# Patient Record
Sex: Female | Born: 1965 | Hispanic: No | Smoking: Never smoker
Health system: Southern US, Community
[De-identification: ages and names within clinical notes are randomized; demographics above are authoritative.]

## PROBLEM LIST (undated history)

## (undated) DIAGNOSIS — M5030 Other cervical disc degeneration, unspecified cervical region: Secondary | ICD-10-CM

## (undated) DIAGNOSIS — G5600 Carpal tunnel syndrome, unspecified upper limb: Secondary | ICD-10-CM

## (undated) DIAGNOSIS — F419 Anxiety disorder, unspecified: Secondary | ICD-10-CM

## (undated) DIAGNOSIS — T7840XA Allergy, unspecified, initial encounter: Secondary | ICD-10-CM

## (undated) DIAGNOSIS — M65332 Trigger finger, left middle finger: Secondary | ICD-10-CM

## (undated) DIAGNOSIS — I10 Essential (primary) hypertension: Secondary | ICD-10-CM

## (undated) DIAGNOSIS — G4733 Obstructive sleep apnea (adult) (pediatric): Secondary | ICD-10-CM

## (undated) HISTORY — DX: Allergy, unspecified, initial encounter: T78.40XA

## (undated) HISTORY — DX: Other cervical disc degeneration, unspecified cervical region: M50.30

## (undated) HISTORY — PX: OTHER SURGICAL HISTORY: SHX169

## (undated) HISTORY — PX: CERVICAL FUSION: SHX112

## (undated) HISTORY — DX: Obstructive sleep apnea (adult) (pediatric): G47.33

## (undated) HISTORY — PX: CRYOTHERAPY: SHX1416

## (undated) HISTORY — DX: Essential (primary) hypertension: I10

## (undated) HISTORY — DX: Anxiety disorder, unspecified: F41.9

## (undated) HISTORY — DX: Carpal tunnel syndrome, unspecified upper limb: G56.00

## (undated) HISTORY — DX: Trigger finger, left middle finger: M65.332

---

## 1998-06-16 ENCOUNTER — Other Ambulatory Visit: Admission: RE | Admit: 1998-06-16 | Discharge: 1998-06-16 | Payer: Self-pay | Admitting: Obstetrics and Gynecology

## 1999-06-22 ENCOUNTER — Other Ambulatory Visit: Admission: RE | Admit: 1999-06-22 | Discharge: 1999-06-22 | Payer: Self-pay | Admitting: *Deleted

## 2000-08-07 ENCOUNTER — Other Ambulatory Visit: Admission: RE | Admit: 2000-08-07 | Discharge: 2000-08-07 | Payer: Self-pay | Admitting: *Deleted

## 2001-08-13 ENCOUNTER — Other Ambulatory Visit: Admission: RE | Admit: 2001-08-13 | Discharge: 2001-08-13 | Payer: Self-pay | Admitting: Obstetrics and Gynecology

## 2002-09-01 ENCOUNTER — Other Ambulatory Visit: Admission: RE | Admit: 2002-09-01 | Discharge: 2002-09-01 | Payer: Self-pay | Admitting: Obstetrics and Gynecology

## 2012-01-07 ENCOUNTER — Ambulatory Visit (INDEPENDENT_AMBULATORY_CARE_PROVIDER_SITE_OTHER): Payer: BC Managed Care – PPO | Admitting: Radiology

## 2012-01-07 DIAGNOSIS — Z23 Encounter for immunization: Secondary | ICD-10-CM

## 2013-04-15 ENCOUNTER — Ambulatory Visit (HOSPITAL_BASED_OUTPATIENT_CLINIC_OR_DEPARTMENT_OTHER): Payer: Self-pay

## 2016-06-11 DIAGNOSIS — Z1231 Encounter for screening mammogram for malignant neoplasm of breast: Secondary | ICD-10-CM | POA: Diagnosis not present

## 2016-06-11 DIAGNOSIS — Z803 Family history of malignant neoplasm of breast: Secondary | ICD-10-CM | POA: Diagnosis not present

## 2016-07-29 DIAGNOSIS — Z01419 Encounter for gynecological examination (general) (routine) without abnormal findings: Secondary | ICD-10-CM | POA: Diagnosis not present

## 2016-11-05 DIAGNOSIS — J324 Chronic pansinusitis: Secondary | ICD-10-CM | POA: Diagnosis not present

## 2016-12-27 DIAGNOSIS — Z23 Encounter for immunization: Secondary | ICD-10-CM | POA: Diagnosis not present

## 2017-05-05 DIAGNOSIS — J309 Allergic rhinitis, unspecified: Secondary | ICD-10-CM | POA: Diagnosis not present

## 2017-05-05 DIAGNOSIS — H9202 Otalgia, left ear: Secondary | ICD-10-CM | POA: Diagnosis not present

## 2017-05-21 DIAGNOSIS — R05 Cough: Secondary | ICD-10-CM | POA: Diagnosis not present

## 2017-05-21 DIAGNOSIS — H938X3 Other specified disorders of ear, bilateral: Secondary | ICD-10-CM | POA: Diagnosis not present

## 2017-06-18 DIAGNOSIS — Z1231 Encounter for screening mammogram for malignant neoplasm of breast: Secondary | ICD-10-CM | POA: Diagnosis not present

## 2017-06-24 DIAGNOSIS — R05 Cough: Secondary | ICD-10-CM | POA: Diagnosis not present

## 2017-08-08 DIAGNOSIS — Z01419 Encounter for gynecological examination (general) (routine) without abnormal findings: Secondary | ICD-10-CM | POA: Diagnosis not present

## 2017-08-08 DIAGNOSIS — Z6824 Body mass index (BMI) 24.0-24.9, adult: Secondary | ICD-10-CM | POA: Diagnosis not present

## 2017-09-17 DIAGNOSIS — L719 Rosacea, unspecified: Secondary | ICD-10-CM | POA: Diagnosis not present

## 2017-12-26 DIAGNOSIS — Z23 Encounter for immunization: Secondary | ICD-10-CM | POA: Diagnosis not present

## 2017-12-26 DIAGNOSIS — Z1159 Encounter for screening for other viral diseases: Secondary | ICD-10-CM | POA: Diagnosis not present

## 2018-02-16 DIAGNOSIS — M67432 Ganglion, left wrist: Secondary | ICD-10-CM | POA: Diagnosis not present

## 2018-02-16 DIAGNOSIS — M79642 Pain in left hand: Secondary | ICD-10-CM | POA: Diagnosis not present

## 2018-03-19 ENCOUNTER — Other Ambulatory Visit: Payer: Self-pay | Admitting: Family Medicine

## 2018-03-19 ENCOUNTER — Ambulatory Visit
Admission: RE | Admit: 2018-03-19 | Discharge: 2018-03-19 | Disposition: A | Discharge status: Home / Self Care | Payer: 59 | Source: Ambulatory Visit | Attending: Family Medicine | Admitting: Family Medicine

## 2018-03-19 DIAGNOSIS — R059 Cough, unspecified: Secondary | ICD-10-CM

## 2018-03-19 DIAGNOSIS — R05 Cough: Secondary | ICD-10-CM

## 2018-03-19 DIAGNOSIS — R079 Chest pain, unspecified: Secondary | ICD-10-CM

## 2019-03-16 ENCOUNTER — Other Ambulatory Visit: Payer: Self-pay | Admitting: Family Medicine

## 2019-03-16 DIAGNOSIS — M542 Cervicalgia: Secondary | ICD-10-CM

## 2019-03-16 DIAGNOSIS — M5412 Radiculopathy, cervical region: Secondary | ICD-10-CM

## 2019-08-07 ENCOUNTER — Other Ambulatory Visit: Payer: 59

## 2019-08-11 ENCOUNTER — Other Ambulatory Visit: Payer: Self-pay

## 2019-08-11 ENCOUNTER — Ambulatory Visit
Admission: RE | Admit: 2019-08-11 | Discharge: 2019-08-11 | Disposition: A | Discharge status: Home / Self Care | Payer: BC Managed Care – PPO | Source: Ambulatory Visit | Attending: Family Medicine | Admitting: Family Medicine

## 2019-08-11 DIAGNOSIS — M5412 Radiculopathy, cervical region: Secondary | ICD-10-CM

## 2019-08-11 DIAGNOSIS — M542 Cervicalgia: Secondary | ICD-10-CM

## 2020-01-27 ENCOUNTER — Encounter (HOSPITAL_COMMUNITY): Payer: Self-pay

## 2020-01-27 ENCOUNTER — Other Ambulatory Visit: Payer: Self-pay

## 2020-01-27 ENCOUNTER — Ambulatory Visit (HOSPITAL_COMMUNITY)
Admission: EM | Admit: 2020-01-27 | Discharge: 2020-01-27 | Disposition: A | Discharge status: Home / Self Care | Payer: BC Managed Care – PPO | Attending: Family Medicine | Admitting: Family Medicine

## 2020-01-27 DIAGNOSIS — T50905A Adverse effect of unspecified drugs, medicaments and biological substances, initial encounter: Secondary | ICD-10-CM

## 2020-01-27 NOTE — ED Triage Notes (Signed)
Pt states she had sudden onset dizziness and sob and anxious. Pt has hx of high blood pressure. Pt is having difficulty ambulating due to shaking and dizziness and numbness in the extremities. Pt is aox4 .

## 2020-01-27 NOTE — Discharge Instructions (Signed)
I believe this is a reaction to the CBD oil Recommend not taking this anymore.  Drink plenty of water.  Take your ativan when you get home.  Go to the ER for worsening symptoms.

## 2020-01-28 NOTE — ED Provider Notes (Signed)
[facility]    CSN: [id number] Arrival date & time: 01/27/20  1353      History   Chief Complaint Chief Complaint  Patient presents with   Dizziness    since 1300    HPI Margaret Patterson is a 54 y.o. female.   Patient is a 54 year old female presents today with dizziness, shortness of breath, anxiousness, dry mouth and numbness and tingling in extremities.  Symptoms have been constant for the past couple hours.  She took a dose of CBD oil this morning prior to going to work.  She has taken this in the past but this was a new product that she had purchased.  Symptoms started a few hours after taking the medication.  Denies any specific chest pain, fevers, chills, cough.  No other new medications.  History of hypertension and takes blood pressure medication to include losartan and Norvasc.  History of anxiety     History reviewed. No pertinent past medical history.  There are no problems to display for this patient.   History reviewed. No pertinent surgical history.  OB History   No obstetric history on file.      Home Medications    Prior to Admission medications   Not on File    Family History History reviewed. No pertinent family history.  Social History Social History   Tobacco Use   Smoking status: Never Smoker   Smokeless tobacco: Never Used  Building services engineer Use: Never used  Substance Use Topics   Alcohol use: Not Currently   Drug use: Never     Allergies   Benazepril and Pseudoephedrine   Review of Systems Review of Systems   Physical Exam Triage Vital Signs ED Triage Vitals  Enc Vitals Group     BP 01/27/20 1400 (!) 151/91     Pulse Rate 01/27/20 1400 (!) 127     Resp 01/27/20 1400 (!) 23     Temp 01/27/20 1400 98 F (36.7 C)     Temp Source 01/27/20 1400 Oral     SpO2 01/27/20 1400 100 %     Weight --      Height --      Head Circumference --      Peak Flow --      Pain Score 01/27/20 1401 0     Pain Loc  --      Pain Edu? --      Excl. in GC? --    No data found.  Updated Vital Signs BP (!) 151/91 (BP Location: Right Arm)    Pulse (!) 127    Temp 98 F (36.7 C) (Oral)    Resp (!) 23    SpO2 100%   Visual Acuity Right Eye Distance:   Left Eye Distance:   Bilateral Distance:    Right Eye Near:   Left Eye Near:    Bilateral Near:     Physical Exam Vitals and nursing note reviewed.  Constitutional:      General: She is not in acute distress.    Appearance: Normal appearance. She is not ill-appearing, toxic-appearing or diaphoretic.  HENT:     Head: Normocephalic.     Nose: Nose normal.     Mouth/Throat:     Mouth: Mucous membranes are dry.  Eyes:     Conjunctiva/sclera: Conjunctivae normal.  Cardiovascular:     Rate and Rhythm: Regular rhythm. Tachycardia present.  Pulmonary:     Effort: Pulmonary effort is normal.  Breath sounds: Normal breath sounds.  Musculoskeletal:        General: Normal range of motion.     Cervical back: Normal range of motion.  Skin:    General: Skin is warm and dry.     Findings: No rash.  Neurological:     Mental Status: She is alert.  Psychiatric:        Mood and Affect: Mood is anxious.        Behavior: Behavior is cooperative.      UC Treatments / Results  Labs (all labs ordered are listed, but only abnormal results are displayed) Labs Reviewed - No data to display  EKG   Radiology No results found.  Procedures Procedures (including critical care time)  Medications Ordered in UC Medications - No data to display  Initial Impression / Assessment and Plan / UC Course  I have reviewed the triage vital signs and the nursing notes.  Pertinent labs & imaging results that were available during my care of the patient were reviewed by me and considered in my medical decision making (see chart for details).     Adverse effect of drug to include CBD oil.  This is most likely diagnosis.  Researched medication and these can be  some atypical side effects. EKG was sinus tachycardia otherwise normal.  No concern for ACS at this time.  Lungs clear on exam.   Heart rate decreased to 107 and patient seemed to calm down after being here for about 1 hour.  States she is feeling slightly better. Recommended go home, drink plenty of water and rest and hopefully the side effects more off the next couple of hours. Recommended not taking  this medication anymore Patient has Ativan at home she can take as needed for anxiety.  Recommended taking this to help with her anxiety If symptoms do not resolve or get worse she will need to go to the ER.  Patient understanding and agree. Final Clinical Impressions(s) / UC Diagnoses   Final diagnoses:  Adverse effect of drug, initial encounter     Discharge Instructions     I believe this is a reaction to the CBD oil Recommend not taking this anymore.  Drink plenty of water.  Take your ativan when you get home.  Go to the ER for worsening symptoms.      ED Prescriptions    None     PDMP not reviewed this encounter.   Janace Aris, NP 01/28/20 [phone]

## 2020-09-28 ENCOUNTER — Encounter: Payer: Self-pay | Admitting: Surgery

## 2020-09-28 ENCOUNTER — Ambulatory Visit: Payer: BC Managed Care – PPO | Admitting: Surgery

## 2020-09-28 ENCOUNTER — Ambulatory Visit: Payer: Self-pay

## 2020-09-28 DIAGNOSIS — M542 Cervicalgia: Secondary | ICD-10-CM

## 2020-09-28 DIAGNOSIS — M4722 Other spondylosis with radiculopathy, cervical region: Secondary | ICD-10-CM | POA: Diagnosis not present

## 2020-09-28 DIAGNOSIS — M4726 Other spondylosis with radiculopathy, lumbar region: Secondary | ICD-10-CM

## 2020-09-28 DIAGNOSIS — M545 Low back pain, unspecified: Secondary | ICD-10-CM | POA: Diagnosis not present

## 2020-09-28 NOTE — Progress Notes (Signed)
Office Visit Note   Patient: Margaret Patterson           Date of Birth: Sep 06, 1965           MRN: [id number] Visit Date: 09/28/2020              Requested by: Deatra Prinston Kynard, MD [phone] [place],  [place] [address] PCP: Deatra Ymani Porcher, MD   Assessment & Plan: Visit Diagnoses:  1. Neck pain   2. Low back pain, unspecified back pain laterality, unspecified chronicity, unspecified whether sciatica present   3. Other spondylosis with radiculopathy, cervical region   4. Other spondylosis with radiculopathy, lumbar region     Plan: With patient's ongoing neck symptoms that are having a significant negative impact on her quality of life and her failed conservative treatment at this point I recommend repeating the cervical MRI and comparing to the study that was done May 2021.  I had long discussion with patient and advised I a am really not sure as to why the providers that she saw [facility] did not refer her to Dr. Yevette Edwards to get his input.  I did advise patient to Dr. Yevette Edwards is a very Lawyer.  Patient will follow-up with me to review her MRI results.  I did advise her that I would reach out to Dr. Yevette Edwards to just make him aware of patient's visit today and the circumstances that led to it.  Patient also stated that her husband had seen Dr. Newell Coral in the past who has retired.  At follow-up visit with me we will decide what she would like to do moving forward in regards to who she would like to see for her neck..  All questions answered.  Follow-Up Instructions: Return in about 2 weeks (around 10/12/2020) for with Meshach Perry review cervical mri scan.   Orders:  Orders Placed This Encounter  Procedures   XR Lumbar Spine 2-3 Views   XR Cervical Spine 2 or 3 views   MR Cervical Spine w/o contrast   No orders of the defined types were placed in this encounter.     Procedures: No procedures performed   Clinical Data: No additional  findings.   Subjective: Chief Complaint  Patient presents with   Lower Back - Pain   Neck - Pain    HPI 55 year old white female who is new patient to clinic comes in today with complaints of neck pain, left upper extremity radiculopathy, low back pain and intermittent numbness and tingling left leg.  Patient states that she has been seen and treated at [facility] by Dr. Althea Charon (primary care sports medicine) and Dr. Modesto Charon (physiatrist).  Patient states that she has had worsening neck symptoms for a couple years and has known history of cervical spinal stenosis.  Dr. Althea Charon ordered a cervical MRI which was done Aug 11, 2019.  That report showed:  CLINICAL DATA:  Initial evaluation for neck pain and swelling with associated numbness in arms. Cervical radiculopathy.   EXAM: MRI CERVICAL SPINE WITHOUT CONTRAST   TECHNIQUE: Multiplanar, multisequence MR imaging of the cervical spine was performed. No intravenous contrast was administered.   COMPARISON:  None available.   FINDINGS: Alignment: Examination degraded by motion artifact.   Dextroscoliosis with mild reversal of the normal cervical lordosis. 2 mm anterolisthesis of C4 on C5, with trace retrolisthesis of C6 on C7. Findings chronic and facet mediated.   Vertebrae: Vertebral body height maintained without evidence for acute or chronic fracture.  Bone marrow signal intensity within normal limits. Heterogeneous marrow changes involving the C5 through T1 vertebral bodies consistent with degenerative change. No discrete or worrisome osseous lesions.   Cord: Signal intensity within the cervical spinal cord is within normal limits.   Posterior Fossa, vertebral arteries, paraspinal tissues: Visualized brain and posterior fossa within normal limits. Craniocervical junction normal. Paraspinous and prevertebral soft tissues within normal limits. Normal flow voids seen within the vertebral arteries bilaterally. Strongly  dominant left vertebral artery noted.   Disc levels:   C2-C3: Normal interspace. Left-sided facet degeneration. No significant canal or foraminal stenosis.   C3-C4: Mild disc bulge with left-sided uncovertebral hypertrophy. Prominent left-sided facet degeneration. No significant spinal stenosis. Moderate left C4 foraminal narrowing. No significant right foraminal stenosis.   C4-C5: Anterolisthesis. Mild diffuse disc bulge with uncovertebral hypertrophy. Advanced left with mild right facet hypertrophy. Flattening of the ventral thecal sac without significant spinal stenosis. Moderate left C5 foraminal stenosis. No significant right foraminal narrowing.   C5-C6: Chronic intervertebral disc space narrowing with diffuse degenerative disc osteophyte. Broad posterior component flattens and partially faces the ventral thecal sac resultant mild spinal stenosis. No significant cord deformity. Severe right with moderate left C6 foraminal narrowing.   C6-C7: Chronic intervertebral disc space narrowing with diffuse degenerative disc osteophyte. Superimposed more focal right paracentral disc osteophyte complex indents the right ventral thecal sac, contacting and flattening the right hemi cord (series 9, image 19). Secondary cord flattening without cord signal changes. Moderate spinal stenosis. Moderate right worse than left C7 foraminal narrowing.   C7-T1: Diffuse disc bulge with bilateral uncovertebral hypertrophy. Left-sided facet degeneration. No significant spinal stenosis. Foramina remain patent.   T1-2: Mild disc bulge with right greater than left facet hypertrophy. No significant canal or foraminal stenosis.   IMPRESSION: 1. Chronic degenerative disc bulge with superimposed right paracentral disc osteophyte complex at C6-7, flattening the right hemicord without cord signal changes. Moderate canal with bilateral C7 foraminal stenosis at this level. 2. Diffuse disc osteophyte at  C5-6 with resultant mild canal, with severe right and moderate left C6 foraminal narrowing. 3. Moderate left C4 and C5 foraminal stenosis related to uncovertebral and facet disease.     Electronically Signed   By: Rise Mu M.D.   On: 08/11/2019 21:13  Patient states that she had conservative management by Dr. Althea Charon with medications and when these were not working who referred her to Dr. Modesto Charon who performed multiple cervical ESI's.  Patient states that she also had formal PT.  She failed all of these methods of conservative treatment.  I asked patient if she was ever referred to orthopedic spine surgeon Dr. Yevette Edwards who is in that office and she told me no.  Patient states that she was getting somewhat frustrated that she was not getting any improvement and that is what brought her to our office today.  She continues to have ongoing constant neck pain that radiates down her arm and into her shoulder blades.  States that her left arm goes numb.  Patient is right-hand dominant.  Her most recent ESI at [facility] was April 2022.  States that she occasionally gets some pain in the low back and with intermittent episodes of numbness and tingling in the left lower extremity but this is not frequent or limiting her.  States that the neck is the worst problem.     Review of Systems No current cardiac pulmonary GI GU issues  Objective: Vital Signs: There were no vitals taken for this  visit.  Physical Exam Constitutional:      Appearance: Normal appearance.  HENT:     Head: Normocephalic and atraumatic.  Eyes:     Extraocular Movements: Extraocular movements intact.  Pulmonary:     Effort: No respiratory distress.  Musculoskeletal:     Comments: Gait is normal.  Cervical spine she has some limitation range of motion due to discomfort.  Positive Spurling test.  Positive bilateral brachial plexus trapezius and scapular tenderness.  Bilateral shoulder exam unremarkable.   Negative logroll bilateral hips.  Negative straight leg raise.  Patient has trace bilateral biceps triceps and grip weakness.  Trace bilateral quad weakness.  Skin:    General: Skin is warm and dry.  Neurological:     Mental Status: She is oriented to person, place, and time.  Psychiatric:        Mood and Affect: Mood normal.    Ortho Exam  Specialty Comments:  No specialty comments available.  Imaging: No results found.   PMFS History: There are no problems to display for this patient.  History reviewed. No pertinent past medical history.  History reviewed. No pertinent family history.  History reviewed. No pertinent surgical history. Social History   Occupational History   Not on file  Tobacco Use   Smoking status: Never   Smokeless tobacco: Never  Vaping Use   Vaping Use: Never used  Substance and Sexual Activity   Alcohol use: Not Currently   Drug use: Never   Sexual activity: Yes

## 2020-09-29 MED ORDER — METHYLPREDNISOLONE 4 MG PO TBPK: ORAL_TABLET | ORAL | 0 refills | Status: DC

## 2020-09-29 NOTE — Telephone Encounter (Signed)
Verbal ok from [place]. Medrol Dosepak 4mg  take as directed.

## 2020-10-04 NOTE — Telephone Encounter (Signed)
Please advise on message below. Thank you!

## 2020-10-05 NOTE — Telephone Encounter (Signed)
Please advise on Valium. °

## 2020-10-06 ENCOUNTER — Telehealth: Payer: Self-pay | Admitting: Surgery

## 2020-10-06 ENCOUNTER — Telehealth: Payer: Self-pay | Admitting: Radiology

## 2020-10-06 MED ORDER — DIAZEPAM 5 MG PO TABS: ORAL_TABLET | ORAL | 0 refills | Status: DC

## 2020-10-06 NOTE — Telephone Encounter (Signed)
I see that you have asked Fayrene Fearing about this. Can you please follow up?

## 2020-10-06 NOTE — Telephone Encounter (Signed)
FYI  Per verbal order from Zonia Kief, PA-C, Valium 5mg  tablet, take one tablet 30 min prior to procedure, may repeat if needed, MUST HAVE DRIVER #2 with no refills called to patient's pharmacy.  I called patient and advised.

## 2020-10-06 NOTE — Telephone Encounter (Signed)
PT called about the vallium asking if you guys can send it in for her? Her appt is tomorrow 10/07/2020.

## 2020-10-06 NOTE — Telephone Encounter (Signed)
Please advise on Rx for Valium.  Patient is having MRI.

## 2020-10-07 ENCOUNTER — Other Ambulatory Visit: Payer: Self-pay

## 2020-10-07 ENCOUNTER — Ambulatory Visit
Admission: RE | Admit: 2020-10-07 | Discharge: 2020-10-07 | Disposition: A | Discharge status: Home / Self Care | Payer: BC Managed Care – PPO | Source: Ambulatory Visit | Attending: Surgery | Admitting: Surgery

## 2020-10-07 DIAGNOSIS — M4722 Other spondylosis with radiculopathy, cervical region: Secondary | ICD-10-CM

## 2020-10-12 ENCOUNTER — Ambulatory Visit: Payer: BC Managed Care – PPO | Admitting: Surgery

## 2020-10-12 ENCOUNTER — Encounter: Payer: Self-pay | Admitting: Surgery

## 2020-10-12 VITALS — BP 137/82 | HR 77

## 2020-10-12 DIAGNOSIS — M4722 Other spondylosis with radiculopathy, cervical region: Secondary | ICD-10-CM

## 2020-10-12 NOTE — Progress Notes (Signed)
55 year old white female history of worsening neck pain and left upper extremity radiculopathy returns for review of her cervical MRI scan that was performed October 07, 2020.  Report showed:  CLINICAL DATA:  Chronic neck pain and left upper extremity radiculopathy.   EXAM: MRI CERVICAL SPINE WITHOUT CONTRAST   TECHNIQUE: Multiplanar, multisequence MR imaging of the cervical spine was performed. No intravenous contrast was administered.   COMPARISON:  Radiographs 09/28/2020 and MRI 08/11/2019   FINDINGS: Alignment: 2 mm of grade 1 degenerative anterolisthesis at C4-5.   Vertebrae: Disc desiccation throughout cervical spine with loss of disc height most striking at C5-6, C6-7, and C7-T1. Mild degenerative endplate findings at each of these levels. Mild degenerative facet edema bilaterally at T1-2   Cord: No significant abnormal spinal cord signal is observed.   Posterior Fossa, vertebral arteries, paraspinal tissues: Unremarkable   Disc levels:   C2-3: No impingement.  Degenerative left facet arthropathy.   C3-4: Moderate to prominent left foraminal stenosis due to facet arthropathy and mild uncinate spurring. Slightly worsened from prior.   C4-5: Moderate left foraminal stenosis due to disc uncovering and facet arthropathy.   C5-6: Prominent bilateral foraminal stenosis and mild central narrowing of the thecal sac due to intervertebral spurring, uncinate spurring, facet arthropathy, and mild disc bulge. Findings similar to prior.   C6-7: Moderate bilateral foraminal stenosis and moderate to prominent right eccentric central narrowing of the thecal sac due to right paracentral disc osteophyte complex and uncinate spurring. Findings are roughly stable from prior.   C7-T1: Mild to moderate right foraminal stenosis and borderline central narrowing of the thecal sac due to disc bulge and uncinate spurring, similar to the prior exam.   T1-2: No impingement.  Right facet  arthropathy.   T2-3: Mild to moderate right foraminal stenosis. This level is only included on the parasagittal images.   IMPRESSION: 1. Spondylosis and degenerative disc disease causing prominent impingement at C5-6; moderate to prominent impingement at C3-4; moderate impingement at C4-5 and C6-7; and mild to moderate impingement at C7-T1 and T2-3, as detailed above. For the most part the degree of impingement appears similar to the prior exam.     Electronically Signed   By: Gaylyn Rong M.D.   On: 10/07/2020 14:24    Patient states that her symptoms are unchanged from previous visit.  She does have an appointment with spine surgeon Dr. Estill Bamberg with [facility] October 23, 2020.  Advised patient that I am glad that she is going to meet with him to discuss treatment options.  Advised patient that Dr. Yevette Edwards does have access to the MRI images and the report.  She can follow-up with me as needed but I did tell patient to give me a call if she has any questions or concerns after her appointment at [facility].  Patient's husband was present at this visit.  All questions were answered.

## 2021-02-27 ENCOUNTER — Other Ambulatory Visit: Payer: Self-pay | Admitting: Neurological Surgery

## 2021-03-06 ENCOUNTER — Encounter (HOSPITAL_COMMUNITY): Admission: RE | Payer: Self-pay | Source: Home / Self Care

## 2021-03-06 ENCOUNTER — Ambulatory Visit (HOSPITAL_COMMUNITY)
Admission: RE | Admit: 2021-03-06 | Payer: BC Managed Care – PPO | Source: Home / Self Care | Admitting: Neurological Surgery

## 2021-03-06 SURGERY — ANTERIOR CERVICAL DECOMPRESSION/DISCECTOMY FUSION 4 LEVELS
Anesthesia: General

## 2022-04-30 ENCOUNTER — Ambulatory Visit (INDEPENDENT_AMBULATORY_CARE_PROVIDER_SITE_OTHER): Payer: BC Managed Care – PPO | Admitting: Orthopaedic Surgery

## 2022-04-30 DIAGNOSIS — M65332 Trigger finger, left middle finger: Secondary | ICD-10-CM

## 2022-04-30 MED ORDER — LIDOCAINE HCL 1 % IJ SOLN
0.3000 mL | INTRAMUSCULAR | Status: AC | PRN
  Administered 2022-04-30: .3 mL

## 2022-04-30 MED ORDER — METHYLPREDNISOLONE ACETATE 40 MG/ML IJ SUSP
13.3300 mg | INTRAMUSCULAR | Status: AC | PRN
  Administered 2022-04-30: 13.33 mg

## 2022-04-30 MED ORDER — BUPIVACAINE HCL 0.5 % IJ SOLN
0.3300 mL | INTRAMUSCULAR | Status: AC | PRN
  Administered 2022-04-30: .33 mL

## 2022-04-30 NOTE — Progress Notes (Signed)
Office Visit Note   Patient: Margaret Patterson           Date of Birth: 03-23-1966           MRN: [id number] Visit Date: 04/30/2022              Requested by: Donald Prose, MD [address] [place],  [place] [address] PCP: Donald Prose, MD   Assessment & Plan: Visit Diagnoses:  1. Trigger finger, left middle finger     Plan: Impression is left long trigger finger.  Disease process reviewed and treatment options were explained.  We will start with a cortisone injection which she tolerated well.  Relative rest activity modification to the left hand.  Follow-up as needed.  Follow-Up Instructions: No follow-ups on file.   Orders:  No orders of the defined types were placed in this encounter.  No orders of the defined types were placed in this encounter.     Procedures: Hand/UE Inj: L long A1 for trigger finger on 04/30/2022 12:02 PM Indications: pain Details: 25 G needle Medications: 0.3 mL lidocaine 1 %; 0.33 mL bupivacaine 0.5 %; 13.33 mg methylPREDNISolone acetate 40 MG/ML Outcome: tolerated well, no immediate complications Consent was given by the patient. Patient was prepped and draped in the usual sterile fashion.       Clinical Data: No additional findings.   Subjective: Chief Complaint  Patient presents with   Right Hand - Pain   Left Hand - Pain    HPI Margaret Patterson is a very pleasant 57 year old female wife of Margaret Patterson.  She comes in for a left long finger that is locking up for about 6 months.  Denies any injuries.  She does type all day for work. Review of Systems  Constitutional: Negative.   HENT: Negative.    Eyes: Negative.   Respiratory: Negative.    Cardiovascular: Negative.   Endocrine: Negative.   Musculoskeletal: Negative.   Neurological: Negative.   Hematological: Negative.   Psychiatric/Behavioral: Negative.    All other systems reviewed and are negative.    Objective: Vital Signs: There were no vitals taken for this  visit.  Physical Exam Vitals and nursing note reviewed.  Constitutional:      Appearance: She is well-developed.  HENT:     Head: Atraumatic.     Nose: Nose normal.  Eyes:     Extraocular Movements: Extraocular movements intact.  Cardiovascular:     Pulses: Normal pulses.  Pulmonary:     Effort: Pulmonary effort is normal.  Abdominal:     Palpations: Abdomen is soft.  Musculoskeletal:     Cervical back: Neck supple.  Skin:    General: Skin is warm.     Capillary Refill: Capillary refill takes less than 2 seconds.  Neurological:     Mental Status: She is alert. Mental status is at baseline.  Psychiatric:        Behavior: Behavior normal.        Thought Content: Thought content normal.        Judgment: Judgment normal.     Ortho Exam Examination of left hand shows tenderness of the A1 pulley of the left long finger.  She has locking of the finger as well. Specialty Comments:  No specialty comments available.  Imaging: No results found.   PMFS History: There are no problems to display for this patient.  No past medical history on file.  No family history on file.  No past surgical history  on file. Social History   Occupational History   Not on file  Tobacco Use   Smoking status: Never   Smokeless tobacco: Never  Vaping Use   Vaping Use: Never used  Substance and Sexual Activity   Alcohol use: Not Currently   Drug use: Never   Sexual activity: Yes

## 2022-06-07 ENCOUNTER — Ambulatory Visit: Payer: BC Managed Care – PPO | Admitting: Orthopaedic Surgery

## 2022-06-13 ENCOUNTER — Ambulatory Visit: Payer: BC Managed Care – PPO | Admitting: Orthopaedic Surgery

## 2022-06-14 ENCOUNTER — Ambulatory Visit: Payer: BC Managed Care – PPO | Admitting: Orthopaedic Surgery

## 2022-06-14 ENCOUNTER — Ambulatory Visit (INDEPENDENT_AMBULATORY_CARE_PROVIDER_SITE_OTHER): Payer: BC Managed Care – PPO

## 2022-06-14 ENCOUNTER — Encounter: Payer: Self-pay | Admitting: Orthopaedic Surgery

## 2022-06-14 DIAGNOSIS — M79644 Pain in right finger(s): Secondary | ICD-10-CM

## 2022-06-14 MED ORDER — NAPROXEN 500 MG PO TABS: 500.0000 mg | ORAL_TABLET | Freq: Two times a day (BID) | ORAL | 3 refills | Status: DC

## 2022-06-14 MED ORDER — PREDNISONE 10 MG (21) PO TBPK: ORAL_TABLET | ORAL | 3 refills | Status: DC

## 2022-06-14 NOTE — Progress Notes (Signed)
   Office Visit Note   Patient: Margaret Patterson           Date of Birth: 16-Nov-1965           MRN: [id number] Visit Date: 06/14/2022              Requested by: Donald Prose, MD [address],  [place] [address] PCP: Donald Prose, MD   Assessment & Plan: Visit Diagnoses:  1. Pain of right middle finger     Plan: Impression is right middle finger PIP OA flare.  Does not really sound like a trigger finger.  I will place her on prednisone Dosepak and naproxen for 2 weeks after steroids.  She is welcome to use Voltaren gel in the meantime.  Follow-up if symptoms do not improve.  Follow-Up Instructions: No follow-ups on file.   Orders:  Orders Placed This Encounter  Procedures   XR Finger Middle Right   Meds ordered this encounter  Medications   predniSONE (STERAPRED UNI-PAK 21 TAB) 10 MG (21) TBPK tablet    Sig: Take as directed    Dispense:  21 tablet    Refill:  3   naproxen (NAPROSYN) 500 MG tablet    Sig: Take 1 tablet (500 mg total) by mouth 2 (two) times daily with a meal.    Dispense:  30 tablet    Refill:  3      Procedures: No procedures performed   Clinical Data: No additional findings.   Subjective: Chief Complaint  Patient presents with   Right Hand - Pain    Middle finger    HPI  Margaret Patterson is a 57 year old female here for right middle finger pain for about 3 weeks.  Feels some swelling in the palm as well.  Concerned that she has a trigger finger.  I saw her a while back for left long trigger finger that responded really well to a trigger finger injection.  Review of Systems   Objective: Vital Signs: There were no vitals taken for this visit.  Physical Exam  Ortho Exam  Examination shows tenderness to the PIP joint with moderate swelling.  Slight decreased range of motion.  Collateral ligaments are stable.  There is no locking or triggering of the finger.  Specialty Comments:  No specialty comments  available.  Imaging: XR Finger Middle Right  Result Date: 06/14/2022 No acute or structural abnormalities.    PMFS History: There are no problems to display for this patient.  History reviewed. No pertinent past medical history.  No family history on file.  History reviewed. No pertinent surgical history. Social History   Occupational History   Not on file  Tobacco Use   Smoking status: Never   Smokeless tobacco: Never  Vaping Use   Vaping Use: Never used  Substance and Sexual Activity   Alcohol use: Not Currently   Drug use: Never   Sexual activity: Yes

## 2023-01-05 IMAGING — MR MR CERVICAL SPINE W/O CM
5 series · 36 of 48 positions shown · non-contrast
Comparison: Radiographs 09/28/2020 and MRI 08/11/2019

CLINICAL DATA: Chronic neck pain and left upper extremity
radiculopathy.

EXAM:
MRI CERVICAL SPINE WITHOUT CONTRAST
TECHNIQUE: Multiplanar, multisequence MR imaging of the cervical spine was
performed. No intravenous contrast was administered.

[Series 2: T2 · sagittal · 3.0mm · 0.41mm/px · 8 of 15 slices shown (1 of 2)]
[im 1/15]
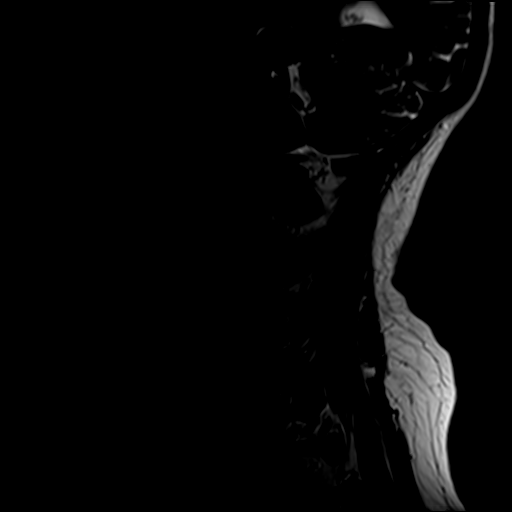
[im 3/15]
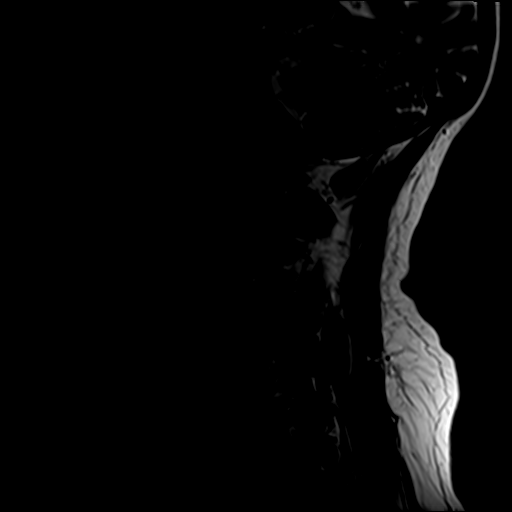
[im 5/15]
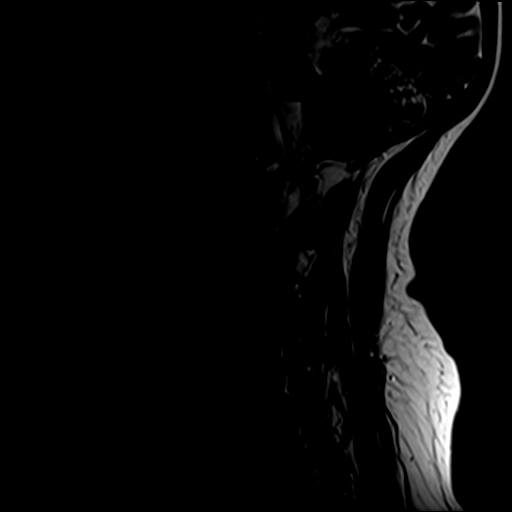
[im 7/15]
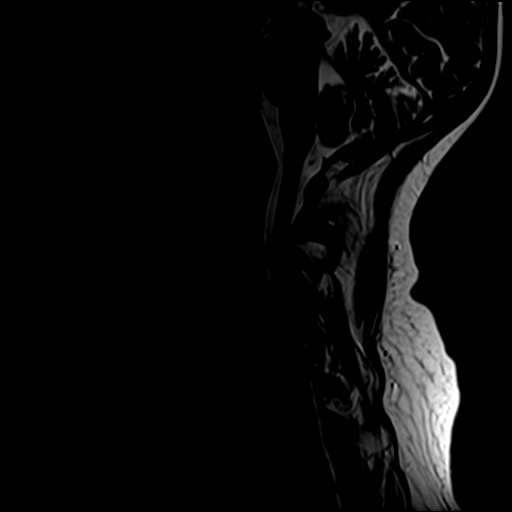
[im 9/15]
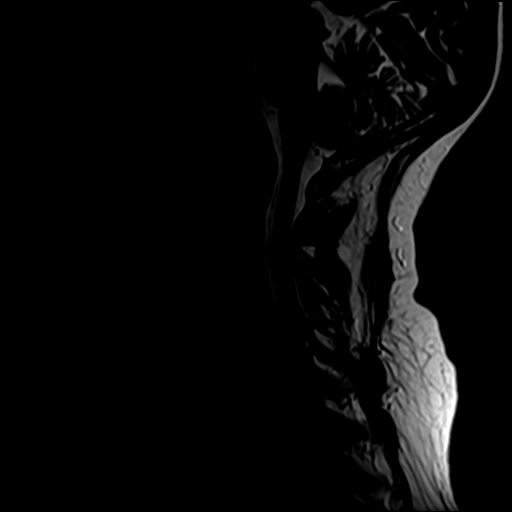
[im 11/15]
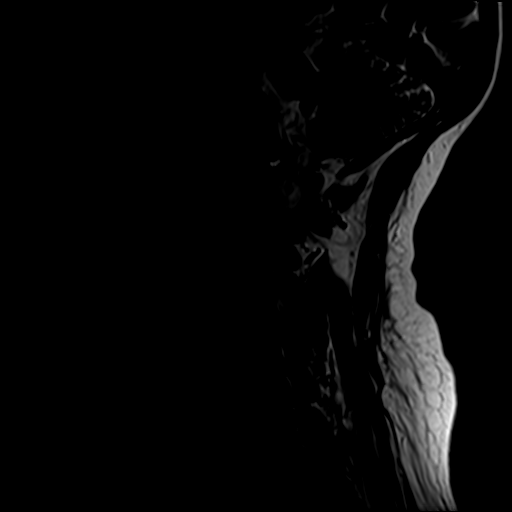
[im 13/15]
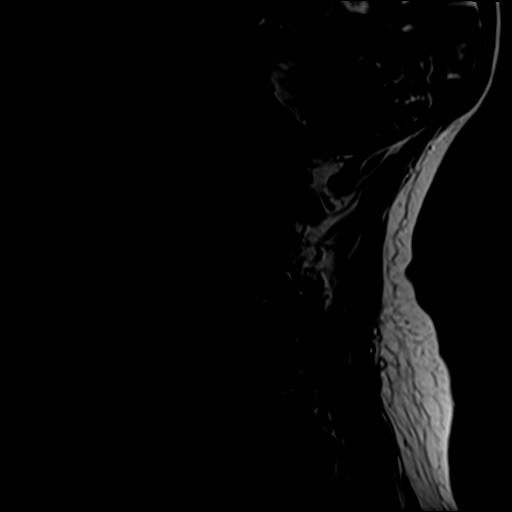
[im 15/15]
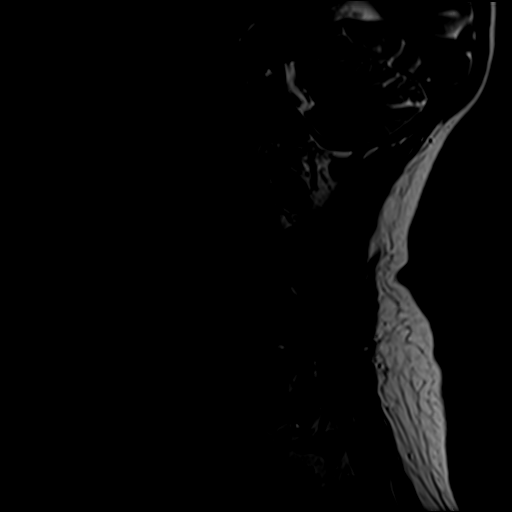

[Series 3: STIR · sagittal · 3.0mm · 0.82mm/px · 8 of 15 slices shown]
[im 1/15]
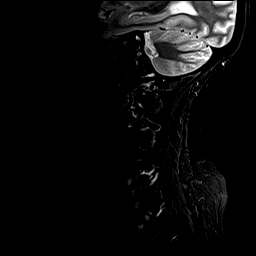
[im 3/15]
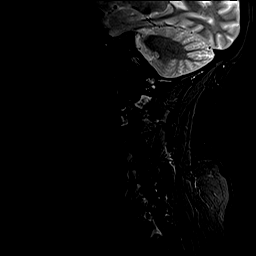
[im 5/15]
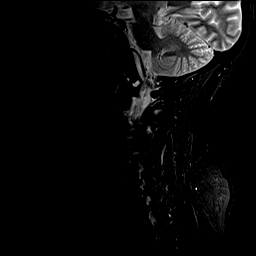
[im 7/15]
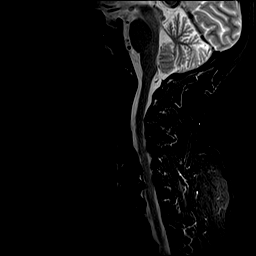
[im 9/15]
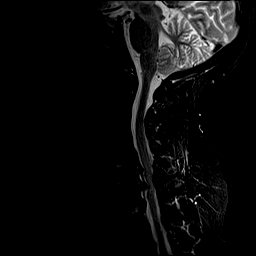
[im 11/15]
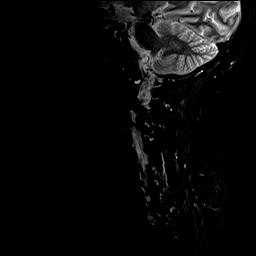
[im 13/15]
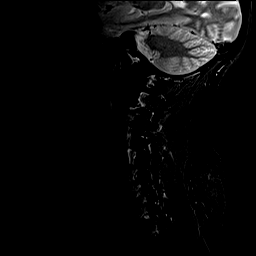
[im 15/15]
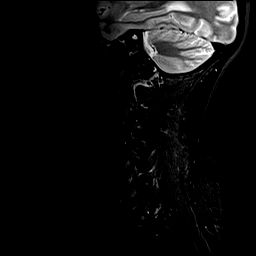

[Series 4: T1 · sagittal · 3.0mm · 0.82mm/px · 8 of 15 slices shown]
[im 1/15]
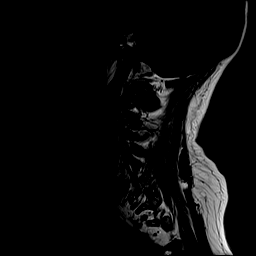
[im 3/15]
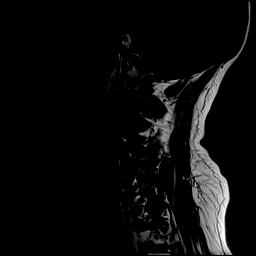
[im 5/15]
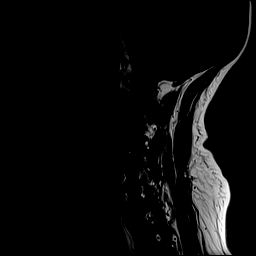
[im 7/15]
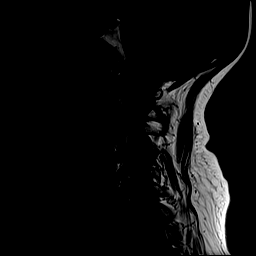
[im 9/15]
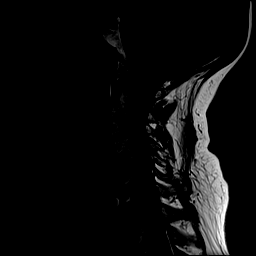
[im 11/15]
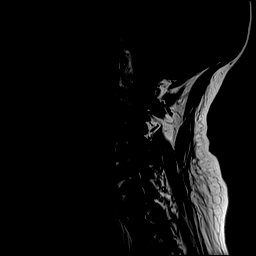
[im 13/15]
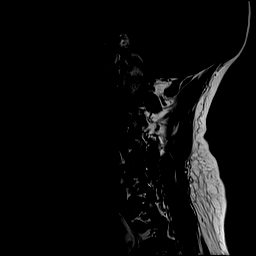
[im 15/15]
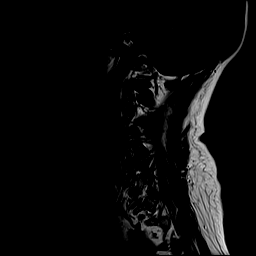

[Series 5: T2 · axial · 3.0mm · 0.70mm/px · z∈[-45,+38]mm · 9 of 24 slices shown (2 of 2)]
[im 1/24]
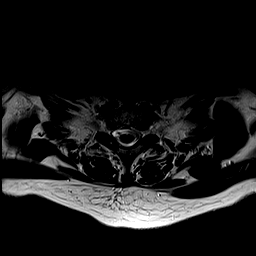
[im 5/24]
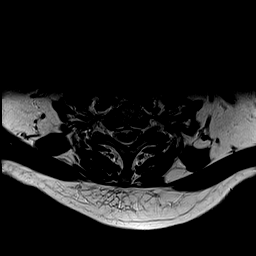
[im 7/24]
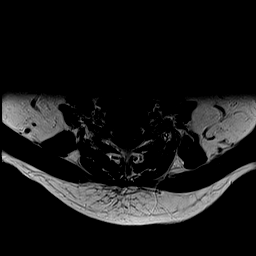
[im 11/24]
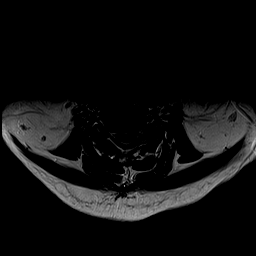
[im 13/24]
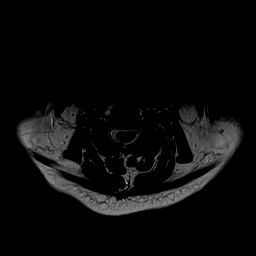
[im 17/24]
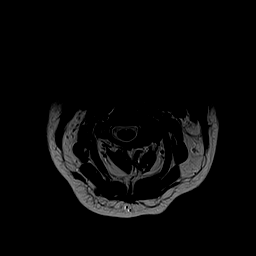
[im 19/24]
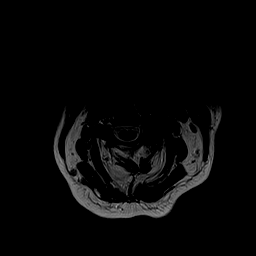
[im 21/24]
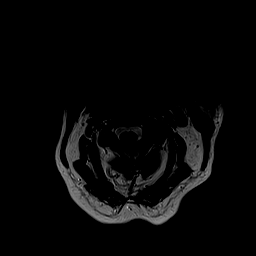
[im 24/24]
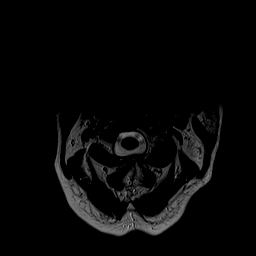

[Series 6: GRE · axial · 3.0mm · 0.35mm/px · z∈[-45,-24]mm · 3 of 24 slices shown]
[im 1/24]
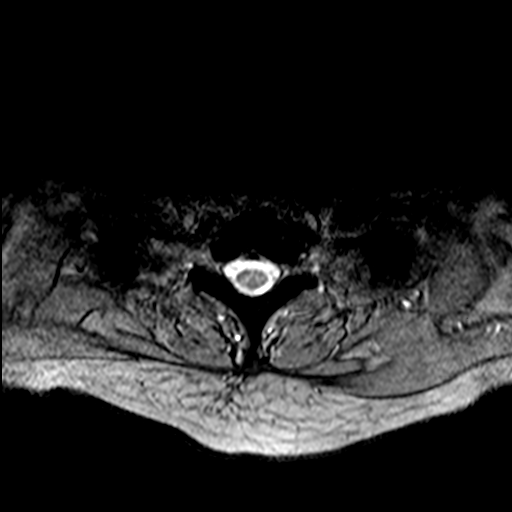
[im 5/24]
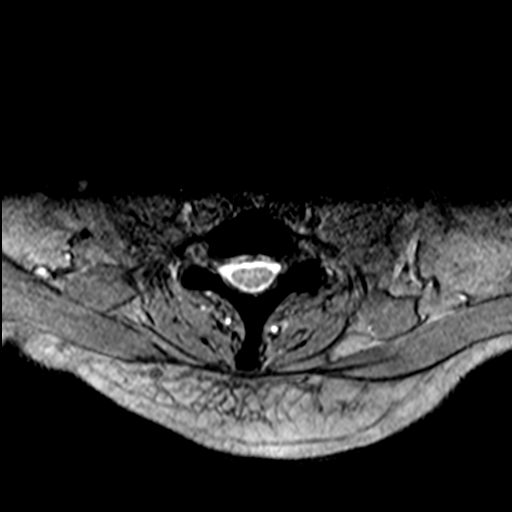
[im 7/24]
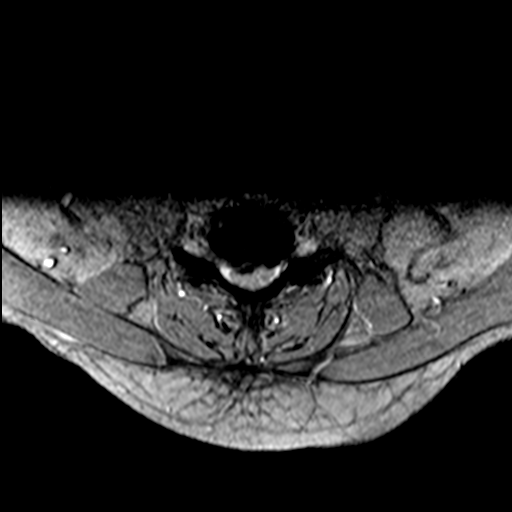

[36 of 48 positions shown; findings below may reference images not displayed]

FINDINGS: Alignment: 2 mm of grade 1 degenerative anterolisthesis at C4-5.

Vertebrae: Disc desiccation throughout cervical spine with loss of
disc height most striking at C5-6, C6-7, and C7-T1. Mild
degenerative endplate findings at each of these levels. Mild
degenerative facet edema bilaterally at T1-2

Cord: No significant abnormal spinal cord signal is observed.

Posterior Fossa, vertebral arteries, paraspinal tissues:
Unremarkable

Disc levels:

C2-3: No impingement.  Degenerative left facet arthropathy.

C3-4: Moderate to prominent left foraminal stenosis due to facet
arthropathy and mild uncinate spurring. Slightly worsened from
prior.

C4-5: Moderate left foraminal stenosis due to disc uncovering and
facet arthropathy.

C5-6: Prominent bilateral foraminal stenosis and mild central
narrowing of the thecal sac due to intervertebral spurring, uncinate
spurring, facet arthropathy, and mild disc bulge. Findings similar
to prior.

C6-7: Moderate bilateral foraminal stenosis and moderate to
prominent right eccentric central narrowing of the thecal sac due to
right paracentral disc osteophyte complex and uncinate spurring.
Findings are roughly stable from prior.

C7-T1: Mild to moderate right foraminal stenosis and borderline
central narrowing of the thecal sac due to disc bulge and uncinate
spurring, similar to the prior exam.

T1-2: No impingement.  Right facet arthropathy.

T2-3: Mild to moderate right foraminal stenosis. This level is only
included on the parasagittal images.
IMPRESSION: 1. Spondylosis and degenerative disc disease causing prominent
impingement at C5-6; moderate to prominent impingement at C3-4;
moderate impingement at C4-5 and C6-7; and mild to moderate
impingement at C7-T1 and T2-3, as detailed above. For the most part
the degree of impingement appears similar to the prior exam.

## 2023-08-25 ENCOUNTER — Encounter: Payer: Self-pay | Admitting: Cardiology

## 2023-08-25 ENCOUNTER — Ambulatory Visit: Attending: Cardiology | Admitting: Cardiology

## 2023-08-25 VITALS — BP 128/72 | HR 63 | Ht 61.0 in | Wt 130.0 lb

## 2023-08-25 DIAGNOSIS — R600 Localized edema: Secondary | ICD-10-CM

## 2023-08-25 DIAGNOSIS — R0602 Shortness of breath: Secondary | ICD-10-CM | POA: Diagnosis not present

## 2023-08-25 DIAGNOSIS — E785 Hyperlipidemia, unspecified: Secondary | ICD-10-CM | POA: Insufficient documentation

## 2023-08-25 DIAGNOSIS — R0609 Other forms of dyspnea: Secondary | ICD-10-CM | POA: Insufficient documentation

## 2023-08-25 DIAGNOSIS — I10 Essential (primary) hypertension: Secondary | ICD-10-CM

## 2023-08-25 NOTE — Progress Notes (Signed)
 Cardiology Office Note:  .   Date:  08/28/2023  ID:  Margaret Patterson, DOB Oct 22, 1965, MRN [id number] PCP: Sun, Vyvyan, MD  [facility] Providers Cardiologist:  Randene Bustard, MD     Chief Complaint  Patient presents with   New Patient (Initial Visit)    Shortness of breath, edema.    Patient Profile: .     Margaret Patterson is a  58 y.o. female with a PMH notable for hypertension, OSA-on CPAP pending who presents here for evaluation of Shortness of Breath, and Edema at the request of Sun, Vyvyan, MD.     Margaret Patterson was seen by Dr. Paulene Boron on February 25, 2023 for a routine annual follow-up.  Noted that her pressures were stable at home and the doctors visits.  Was on amlodipine 10 mg losartan 100 daily.  Was exercising regularly and denied any chest pain or pressure with rest or exertion. She subsequently contacted her PCP's office on 06/06/2023 to comment on a few symptoms of shortness of breath and swelling.  Apparently her husband had just been evaluated for valvular heart disease with pending valve replacement.  She asked for referral to "ease her mind ".  Subjective  Discussed the use of AI scribe software for clinical note transcription with the patient, who gave verbal consent to proceed.  History of Present Illness Margaret Patterson is a 58 year old female with hypertension and sleep apnea who presents with shortness of breath and lightheadedness. She is accompanied by her husband, Margaret Patterson. She was referred by Dr. Paulene Boron for evaluation of her symptoms and management of her cardiovascular conditions.  She experiences shortness of breath and lightheadedness, particularly during physical activities such as walking through grocery stores or cleaning the litter box. These symptoms have been persistent and sometimes occur with minimal exertion. She also experiences occasional heart palpitations described as 'kind of weird'. No chest pain, tightness, or significant palpitations that  are bothersome.  She has a history of hypertension and is currently on medication for blood pressure management. She is also on CPAP therapy for sleep apnea, which she uses regularly at night. Despite CPAP use, she still experiences up to twenty episodes of apnea per night, though she feels better when episodes decrease to seven or eight.  She reports swelling in her legs, which she notices more during the day when wearing socks, but does not observe significant swelling in the morning.  Her cholesterol levels were noted to be high during her last blood work. She has made dietary changes, including reducing junk food intake and increasing fresh food consumption, in an effort to manage her cholesterol levels. She has not been on any medication for cholesterol yet.  Her husband is scheduled for a TAVR procedure in two weeks, which was postponed due to work commitments.  Cardiovascular ROS: positive for - dyspnea on exertion, edema, and lightheaded with exertion negative for - chest pain, irregular heartbeat, orthopnea, palpitations, paroxysmal nocturnal dyspnea, rapid heart rate, shortness of breath, or syncope/near syncope; TIA/amaurosis fugax, claudication  ROS:  Review of Systems - Negative except Sx above     Objective   Medications: - Amlodipine 10 mg daily, losartan 100 mg daily - Lexapro 10 mg-one half tab daily; Ativan 0.5 mg nightly, Naprosyn  to 20 mg daily  Studies Reviewed: Aaron Aas   EKG Interpretation Date/Time:  Monday Aug 25 2023 13:41:59 EDT Ventricular Rate:  63 PR Interval:  138 QRS Duration:  72 QT Interval:  418 QTC Calculation: 427 R  Axis:   42  Text Interpretation: Normal sinus rhythm Nonspecific T wave abnormality When compared with ECG of 27-Jan-2020 14:10, Vent. rate has decreased BY  54 BPM T wave inversion now evident in Anterior leads Confirmed by Randene Bustard ([id number]) on 08/25/2023 1:56:59 PM    Labs and PCP November 2024: NA 138, K+ 5.0, CL 99, CO2 32, BUN 12,  Cr 0.74, Glu 96, Ca++ 10.3, ALP 94, AST 37, ALT 27; TC 301, TG 94, HDL 111, LDLcalc 175.  WBC 5.4, H/H14.2/40.9, PLT 257  Risk Assessment/Calculations:         Physical Exam:   VS:  BP 128/72 (BP Location: Left Arm, Patient Position: Sitting)   Pulse 63   Ht 5\' 1"  (1.549 m)   Wt 130 lb (59 kg)   SpO2 94%   BMI 24.56 kg/m    Wt Readings from Last 3 Encounters:  08/25/23 130 lb (59 kg)    GEN: Well nourished, well groomed in no acute distress;  NECK: No JVD; No carotid bruits CARDIAC: Normal S1, S2; RRR, no murmurs, rubs, gallops RESPIRATORY:  Clear to auscultation without rales, wheezing or rhonchi ; nonlabored, good air movement. ABDOMEN: Soft, non-tender, non-distended EXTREMITIES:  No edema; No deformity      ASSESSMENT AND PLAN: .    Problem List Items Addressed This Visit       Cardiology Problems   Essential hypertension (Chronic)   Well-controlled under primary care management. Reassured effective management by primary care. - On amlodipine 10 mg daily, losartan 100 mg daily.       Relevant Orders   EKG 12-Lead (Completed)   Cardiac Stress Test: Informed Consent Details: Physician/Practitioner Attestation; Transcribe to consent form and obtain patient signature   Hyperlipidemia LDL goal <100   High cholesterol levels. Discussed lifestyle changes and potential medication need. Plan to assess cardiovascular risk before medication. - Consider coronary calcium score if initial tests are normal to evaluate baseline risk and guide treatment decisions      Relevant Orders   Cardiac Stress Test: Informed Consent Details: Physician/Practitioner Attestation; Transcribe to consent form and obtain patient signature   ECHOCARDIOGRAM COMPLETE     Other   DOE (dyspnea on exertion) - Primary (Chronic)   - Order echocardiogram to assess cardiac function. - Perform treadmill stress test to evaluate exertional symptoms and cardiac response.      Relevant Orders   Cardiac  Stress Test: Informed Consent Details: Physician/Practitioner Attestation; Transcribe to consent form and obtain patient signature   ECHOCARDIOGRAM COMPLETE   EXERCISE TOLERANCE TEST (ETT)   Lower extremity edema   - Order echocardiogram to assess cardiac function.       Relevant Orders   EKG 12-Lead (Completed)   Other Visit Diagnoses       Shortness of breath       Relevant Orders   EKG 12-Lead (Completed)   ECHOCARDIOGRAM COMPLETE   EXERCISE TOLERANCE TEST (ETT)           Informed Consent   Shared Decision Making/Informed Consent The risks [chest pain, shortness of breath, cardiac arrhythmias, dizziness, blood pressure fluctuations, myocardial infarction, stroke/transient ischemic attack, and life-threatening complications (estimated to be 1 in 10,000)], benefits (risk stratification, diagnosing coronary artery disease, treatment guidance) and alternatives of an exercise tolerance test were discussed in detail with Margaret Patterson and she agrees to proceed.      Follow-Up: Return in about 2 months (around 10/25/2023) for Routine Follow-up after testing ~ 1-2 months.  Signed, Arleen Lacer, MD, MS Randene Bustard, M.D., M.S. Interventional Chartered certified accountant  Pager # [phone]

## 2023-08-25 NOTE — Patient Instructions (Signed)
 Medication Instructions:   No changes *If you need a refill on your cardiac medications before your next appointment, please call your pharmacy*   Lab Work: Not needed    Testing/Procedures: Your physician has requested that you have an echocardiogram. Echocardiography is a painless test that uses sound waves to create images of your heart. It provides your doctor with information about the size and shape of your heart and how well your heart's chambers and valves are working. This procedure takes approximately one hour. There are no restrictions for this procedure. Please do NOT wear cologne, perfume, aftershave, or lotions (deodorant is allowed). Please arrive 15 minutes prior to your appointment time.  Please note: We ask at that you not bring children with you during ultrasound (echo/ vascular) testing. Due to room size and safety concerns, children are not allowed in the ultrasound rooms during exams. Our front office staff cannot provide observation of children in our lobby area while testing is being conducted. An adult accompanying a patient to their appointment will only be allowed in the ultrasound room at the discretion of the ultrasound technician under special circumstances. We apologize for any inconvenience.  and  Will schedule be at [address] Your physician has requested that you have an exercise tolerance test.. An exercise tolerance test is a test to check how your heart works during exercise. You will need to walk on a treadmill or for this test. An electrocardiogram (ECG) will record your heartbeat when you are at rest and when you are exercising.  Please also follow instruction sheet, as given.   Do not drink or eat foods with caffeine for 24 hours before the test. (Chocolate, coffee, tea, or energy drinks) If you use an inhaler, bring it with you to the test. Do not smoke for 4 hours before the test. Wear comfortable shoes and clothing.   Follow-Up: At [facility], you and your health needs are our priority.  As part of our continuing mission to provide you with exceptional heart care, we have created designated Provider Care Teams.  These Care Teams include your primary Cardiologist (physician) and Advanced Practice Providers (APPs -  Physician Assistants and Nurse Practitioners) who all work together to provide you with the care you need, when you need it.     Your next appointment:   1 month(s)  The format for your next appointment:   In Person  Provider:   Randene Bustard, MD

## 2023-08-28 ENCOUNTER — Encounter: Payer: Self-pay | Admitting: Cardiology

## 2023-08-28 DIAGNOSIS — I10 Essential (primary) hypertension: Secondary | ICD-10-CM | POA: Insufficient documentation

## 2023-08-28 DIAGNOSIS — R600 Localized edema: Secondary | ICD-10-CM | POA: Insufficient documentation

## 2023-08-28 NOTE — Assessment & Plan Note (Signed)
 Well-controlled under primary care management. Reassured effective management by primary care. - On amlodipine 10 mg daily, losartan 100 mg daily.

## 2023-08-28 NOTE — Assessment & Plan Note (Signed)
 High cholesterol levels. Discussed lifestyle changes and potential medication need. Plan to assess cardiovascular risk before medication. - Consider coronary calcium score if initial tests are normal to evaluate baseline risk and guide treatment decisions

## 2023-08-28 NOTE — Assessment & Plan Note (Signed)
-   Order echocardiogram to assess cardiac function.

## 2023-08-28 NOTE — Assessment & Plan Note (Signed)
-   Order echocardiogram to assess cardiac function. - Perform treadmill stress test to evaluate exertional symptoms and cardiac response.

## 2023-10-01 ENCOUNTER — Encounter (HOSPITAL_COMMUNITY): Payer: Self-pay | Admitting: *Deleted

## 2023-10-02 ENCOUNTER — Ambulatory Visit: Payer: Self-pay | Admitting: Cardiology

## 2023-10-02 ENCOUNTER — Ambulatory Visit (HOSPITAL_COMMUNITY)
Admission: RE | Admit: 2023-10-02 | Discharge: 2023-10-02 | Disposition: A | Discharge status: Home / Self Care | Source: Ambulatory Visit | Attending: Cardiovascular Disease | Admitting: Cardiovascular Disease

## 2023-10-02 DIAGNOSIS — R0609 Other forms of dyspnea: Secondary | ICD-10-CM

## 2023-10-02 DIAGNOSIS — R9430 Abnormal result of cardiovascular function study, unspecified: Secondary | ICD-10-CM

## 2023-10-02 DIAGNOSIS — R0602 Shortness of breath: Secondary | ICD-10-CM

## 2023-10-02 DIAGNOSIS — E785 Hyperlipidemia, unspecified: Secondary | ICD-10-CM | POA: Diagnosis present

## 2023-10-02 LAB — ECHOCARDIOGRAM COMPLETE
Area-P 1/2: 3.51 cm2
S' Lateral: 2.3 cm

## 2023-10-06 ENCOUNTER — Ambulatory Visit (HOSPITAL_COMMUNITY)
Admission: RE | Admit: 2023-10-06 | Discharge: 2023-10-06 | Disposition: A | Discharge status: Home / Self Care | Source: Ambulatory Visit | Attending: Cardiovascular Disease | Admitting: Cardiovascular Disease

## 2023-10-06 DIAGNOSIS — R0602 Shortness of breath: Secondary | ICD-10-CM | POA: Insufficient documentation

## 2023-10-06 DIAGNOSIS — R0609 Other forms of dyspnea: Secondary | ICD-10-CM | POA: Insufficient documentation

## 2023-10-06 LAB — EXERCISE TOLERANCE TEST
Angina Index: 0
Duke Treadmill Score: 4
Estimated workload: 6.2
Exercise duration (min): 4 min
Exercise duration (sec): 21 s
MPHR: 161 {beats}/min
Peak HR: 161 {beats}/min
Percent HR: 100 %
Rest HR: 85 {beats}/min
ST Depression (mm): 0 mm

## 2023-10-07 NOTE — Telephone Encounter (Signed)
-----   Message from Alm Clay sent at 10/07/2023 11:51 AM EDT ----- Treadmill stress test was read as low risk.  No evidence of ischemia.  However there was evidence of induced premature ventricular contractions.  With this finding on the treadmill portion, I think we probably need to step up the assessment to add imaging with it.  Would like to do a Treadmill Myoview stress test which would be essentially the  same treadmill portion, but we would take pictures of your heart with a nuclear scanner before and after exercise.  This way we can look for blockages or areas of the heart until Enough blood flow  during exercise that do during rest.  No real difference in risks or side effects, just more data.\  Alm Clay, MD  ----- Message ----- From: Santo Stanly LABOR, MD Sent: 10/06/2023   4:01 PM EDT To: Alm LELON Clay, MD

## 2023-10-07 NOTE — Telephone Encounter (Signed)
 The patient has been notified of the result and verbalized understanding.  All questions (if any) were answered. Patient is no agreement to proceed with treadmill myoview - stress. Rn informed patient will attempt to have test schedule prior to 10/24/23 appt if not will reschedule appt  until after test is completed. Gladis Reena GAILS, RN 10/07/2023 4:44 PM

## 2023-10-08 ENCOUNTER — Telehealth (HOSPITAL_COMMUNITY): Payer: Self-pay | Admitting: *Deleted

## 2023-10-08 ENCOUNTER — Encounter (HOSPITAL_COMMUNITY): Payer: Self-pay | Admitting: *Deleted

## 2023-10-08 NOTE — Telephone Encounter (Signed)
 Instructions for MPI sent via [employer].

## 2023-10-14 ENCOUNTER — Other Ambulatory Visit: Payer: Self-pay | Admitting: Cardiology

## 2023-10-14 DIAGNOSIS — R0602 Shortness of breath: Secondary | ICD-10-CM

## 2023-10-14 DIAGNOSIS — R0609 Other forms of dyspnea: Secondary | ICD-10-CM

## 2023-10-14 DIAGNOSIS — R9430 Abnormal result of cardiovascular function study, unspecified: Secondary | ICD-10-CM

## 2023-10-16 ENCOUNTER — Ambulatory Visit: Payer: Self-pay | Admitting: Cardiology

## 2023-10-16 ENCOUNTER — Ambulatory Visit (HOSPITAL_COMMUNITY)
Admission: RE | Admit: 2023-10-16 | Discharge: 2023-10-16 | Disposition: A | Discharge status: Home / Self Care | Source: Ambulatory Visit | Attending: Cardiology | Admitting: Cardiology

## 2023-10-16 DIAGNOSIS — R9430 Abnormal result of cardiovascular function study, unspecified: Secondary | ICD-10-CM | POA: Insufficient documentation

## 2023-10-16 DIAGNOSIS — R0609 Other forms of dyspnea: Secondary | ICD-10-CM | POA: Insufficient documentation

## 2023-10-16 DIAGNOSIS — R0602 Shortness of breath: Secondary | ICD-10-CM | POA: Diagnosis present

## 2023-10-16 LAB — MYOCARDIAL PERFUSION IMAGING
Angina Index: 0
Duke Treadmill Score: 6
Estimated workload: 7
Exercise duration (min): 6 min
Exercise duration (sec): 0 s
LV dias vol: 51 mL (ref 46–106)
LV sys vol: 11 mL (ref 3.8–5.2)
MPHR: 161 {beats}/min
Nuc Stress EF: 78 %
Peak HR: 162 {beats}/min
Percent HR: 100 %
Rest HR: 72 {beats}/min
Rest Nuclear Isotope Dose: 9.6 mCi
SDS: 0
SRS: 0
SSS: 0
ST Depression (mm): 0 mm
Stress Nuclear Isotope Dose: 31.3 mCi
TID: 1.2

## 2023-10-16 MED ORDER — TECHNETIUM TC 99M TETROFOSMIN IV KIT
9.6000 | PACK | Freq: Once | INTRAVENOUS | Status: AC | PRN
  Administered 2023-10-16: 9.6 via INTRAVENOUS

## 2023-10-16 MED ORDER — TECHNETIUM TC 99M TETROFOSMIN IV KIT
31.3000 | PACK | Freq: Once | INTRAVENOUS | Status: AC | PRN
  Administered 2023-10-16: 31.3 via INTRAVENOUS

## 2023-10-24 ENCOUNTER — Encounter: Payer: Self-pay | Admitting: Cardiology

## 2023-10-24 ENCOUNTER — Ambulatory Visit: Attending: Cardiology | Admitting: Cardiology

## 2023-10-24 VITALS — BP 118/68 | HR 88 | Resp 16 | Ht 61.0 in | Wt 129.6 lb

## 2023-10-24 DIAGNOSIS — E785 Hyperlipidemia, unspecified: Secondary | ICD-10-CM | POA: Diagnosis not present

## 2023-10-24 DIAGNOSIS — I10 Essential (primary) hypertension: Secondary | ICD-10-CM | POA: Diagnosis not present

## 2023-10-24 DIAGNOSIS — R0609 Other forms of dyspnea: Secondary | ICD-10-CM

## 2023-10-24 DIAGNOSIS — R600 Localized edema: Secondary | ICD-10-CM

## 2023-10-24 NOTE — Patient Instructions (Signed)
 Medication Instructions:  No changes  *If you need a refill on your cardiac medications before your next appointment, please call your pharmacy*   Lab Work: Not needed If you have labs (blood work) drawn today and your tests are completely normal, you will receive your results only by: MyChart Message (if you have MyChart) OR A paper copy in the mail If you have any lab test that is abnormal or we need to change your treatment, we will call you to review the results.   Testing/Procedures:  Not needed  Follow-Up: At [facility], you and your health needs are our priority.  As part of our continuing mission to provide you with exceptional heart care, we have created designated Provider Care Teams.  These Care Teams include your primary Cardiologist (physician) and Advanced Practice Providers (APPs -  Physician Assistants and Nurse Practitioners) who all work together to provide you with the care you need, when you need it.     Your next appointment:   As needed   The format for your next appointment:   In Person  Provider:   Alm Clay, MD

## 2023-10-24 NOTE — Assessment & Plan Note (Addendum)
 Echocardiogram relatively benign.  Only grade 1 diastolic function was probably normal for age.  But she did show signs of hyperdynamic LV on the Myoview  and echocardiogram and that would potentially potentiate symptoms.  Nonischemic Myoview  makes it less likely to be related to CAD and more likely related to deconditioning and poor hydration.  Continue adequate hydration continue to work on increasing exercise level, weight loss to improve conditioning.  If symptoms were to persist, potentially consider CPX testing (cardiopulmonary exercise test)

## 2023-10-24 NOTE — Assessment & Plan Note (Signed)
 Blood pressure seems well-controlled on amlodipine 10 mg daily and losartan 100 mg daily.  Continue current meds with low threshold to consider addition of diuretic if pressures become higher.

## 2023-10-24 NOTE — Progress Notes (Signed)
 Cardiology Office Note:  .   Date:  10/24/2023  ID:  Margaret Patterson, DOB 1965/09/16, MRN [id number] PCP: Patterson, Vyvyan, MD  [facility] Providers Cardiologist:  Alm Clay, MD     Chief Complaint  Patient presents with   Post Test GXT    Sent for treadmill Myoview  along with echo   Follow-up    1 month test results    Patient Profile: .     Margaret Patterson is a  58 y.o. female with a PMH notable for hypertension, as well as OSA on CPAP who presents here for follow-up evaluation of Exertional Shortness of Breath and Edema.  Initially referred at the request of Patterson, Vyvyan, MD.     Margaret Patterson was last seen on 08/25/2023 for evaluation of shortness of breath and swelling.  She noted exertional dyspnea during grocery shopping and cleaning up the litter box.  Sometimes occurring with minimal exertion.  Occasionally noting some heart palpitations but no real chest pain.  Also noted some swelling at the end of the day.  (DOE, edema, and lightheadedness with exertion). => Plan was to assess with 2D echocardiogram and GXT.  Unfortunately GXT was read as intermediate risk due to exercise-induced PVCs.  We therefore proceeded with a treadmill Myoview  stress test which she actually did better on for exercise standpoint and had no PVCs.  There was no evidence of ischemia or infarction.--Reviewed below.  He now presents to discuss results.  Subjective   Margaret Patterson returns for follow-up to discuss test results.  She still has some exertional dyspnea and some occasional palpitations but was very reassured to hear the results of her studies.  She is has been under quite a bit of stress of late and tends to worry about things according to her husband.  But she placed her on a call that day off.  She still has exertional dyspnea but no chest pain or pressure.  Palpitations are not there but it is more that she feels her heart rate going faster than usual.  I discussed with her the fact that  she had a pretty rapid heart rate response with exercise and occasionally exercise related PVCs which in the absence of ischemia that was noted on her Myoview , is probably likely that the PVCs/palpitations and the rapid heart rate response is related to combination of deconditioning and likely inadequate hydration.  She has not had any chest pain or pressure aggressive exertion.  No PND, orthopnea or edema.  No prolonged rapid irregular beats palpitations only with noting her heart rate goes faster with exercise than she would expect.  She denies any syncope or near-syncope, TIA or amaurosis fugax.  No claudication. We reviewed the results of the echocardiogram the ETT and the Treadmill Myoview  results together in clinic and I explained the small potential PFO, hyperdynamic LV and PVCs etc.    Objective    Studies Reviewed: .        Echocardiogram: EF 60 to 65%.  No RWMA.  Normal diastolic parameters.  Normal RV.  Normal aortic and mitral valves.  Normal RAP and RVP.  Cannot exclude small PFO.  (10/02/2023) GXT/ETT: Exercised for: 21 minutes.  6.2 METS.  Max heart rate 162 bpm - 100% MPHR.  Electrical negative for ischemia but intermediate risk due to PVCs.  (10/06/2023) TM Myoview : LOW RISK/NORMAL.  No evidence of ischemia or infarction.  Hyperdynamic stress EF of greater than 65%.  No RWMA.  Exercise portion had no exercise-induced PVCs.  Able to exercise 6 minutes.  Peak heart rate 162 bpm - 100% MPHR.  7 METS.  (10/16/2023)  Risk Assessment/Calculations:              Physical Exam:   VS:  BP 118/68 (BP Location: Left Arm, Patient Position: Sitting, Cuff Size: Normal)   Pulse 88   Resp 16   Ht 5' 1 (1.549 m)   Wt 129 lb 9.6 oz (58.8 kg)   SpO2 93%   BMI 24.49 kg/m    Wt Readings from Last 3 Encounters:  10/24/23 129 lb 9.6 oz (58.8 kg)  10/06/23 130 lb (59 kg)  08/25/23 130 lb (59 kg)    GEN: Well nourished, well groomed; in no acute distress; healthy appearing NECK: No JVD; No  carotid bruits CARDIAC: Normal S1, S2; RRR, no murmurs, rubs, gallops RESPIRATORY:  Clear to auscultation without rales, wheezing or rhonchi ; nonlabored, good air movement. ABDOMEN: Soft, non-tender, non-distended EXTREMITIES:  No edema; No deformity      ASSESSMENT AND PLAN: .    Problem List Items Addressed This Visit       Cardiology Problems   Essential hypertension (Chronic)   Blood pressure seems well-controlled on amlodipine 10 mg daily and losartan 100 mg daily.  Continue current meds with low threshold to consider addition of diuretic if pressures become higher.      Hyperlipidemia LDL goal <100 (Chronic)   Starting LDL less than 100 and most recent labs showed LDL was notably higher at 888 back in November 2024.  Would continue to monitor now but low threshold to check Coronary Calcium Score in order to stratify plus or minus addition of statin.  Can defer to PCP.        Other   DOE (dyspnea on exertion) - Primary (Chronic)   Echocardiogram relatively benign.  Only grade 1 diastolic function was probably normal for age.  But she did show signs of hyperdynamic LV on the Myoview  and echocardiogram and that would potentially potentiate symptoms.  Nonischemic Myoview  makes it less likely to be related to CAD and more likely related to deconditioning and poor hydration.  Continue adequate hydration continue to work on increasing exercise level, weight loss to improve conditioning.  If symptoms were to persist, potentially consider CPX testing (cardiopulmonary exercise test)      Lower extremity edema   Echo was relatively reassuring.  Could potentially be venous stasis related.  Consider support stockings                Follow-Up: Return if symptoms worsen or fail to improve, for Followup when necessary.      Signed, Alm MICAEL Clay, MD, MS Alm Clay, M.D., M.S. Interventional Chartered certified accountant  Pager # [phone]

## 2023-10-24 NOTE — Assessment & Plan Note (Addendum)
 Echo was relatively reassuring.  Could potentially be venous stasis related.  Consider support stockings

## 2023-10-24 NOTE — Assessment & Plan Note (Signed)
 Starting LDL less than 100 and most recent labs showed LDL was notably higher at 888 back in November 2024.  Would continue to monitor now but low threshold to check Coronary Calcium Score in order to stratify plus or minus addition of statin.  Can defer to PCP.
